# Patient Record
Sex: Male | Born: 1965 | Race: Black or African American | Hispanic: No | Marital: Married | Smoking: Current every day smoker
Health system: Southern US, Community
[De-identification: ages and names within clinical notes are randomized; demographics above are authoritative.]

## PROBLEM LIST (undated history)

## (undated) DIAGNOSIS — I10 Essential (primary) hypertension: Secondary | ICD-10-CM

## (undated) HISTORY — PX: MANDIBLE FRACTURE SURGERY: SHX706

---

## 1998-12-04 ENCOUNTER — Emergency Department (HOSPITAL_COMMUNITY): Admission: EM | Admit: 1998-12-04 | Discharge: 1998-12-04 | Payer: Self-pay | Admitting: Emergency Medicine

## 2000-03-04 ENCOUNTER — Emergency Department (HOSPITAL_COMMUNITY): Admission: EM | Admit: 2000-03-04 | Discharge: 2000-03-04 | Payer: Self-pay | Admitting: Emergency Medicine

## 2001-04-03 ENCOUNTER — Emergency Department (HOSPITAL_COMMUNITY): Admission: EM | Admit: 2001-04-03 | Discharge: 2001-04-03 | Payer: Self-pay | Admitting: Emergency Medicine

## 2001-04-03 ENCOUNTER — Encounter: Payer: Self-pay | Admitting: Emergency Medicine

## 2002-12-30 ENCOUNTER — Encounter: Payer: Self-pay | Admitting: Emergency Medicine

## 2002-12-30 ENCOUNTER — Emergency Department (HOSPITAL_COMMUNITY): Admission: AC | Admit: 2002-12-30 | Discharge: 2002-12-30 | Payer: Self-pay

## 2003-01-07 ENCOUNTER — Emergency Department (HOSPITAL_COMMUNITY): Admission: EM | Admit: 2003-01-07 | Discharge: 2003-01-07 | Payer: Self-pay | Admitting: Emergency Medicine

## 2006-06-24 ENCOUNTER — Emergency Department (HOSPITAL_COMMUNITY): Admission: EM | Admit: 2006-06-24 | Discharge: 2006-06-25 | Payer: Self-pay | Admitting: Emergency Medicine

## 2008-12-20 ENCOUNTER — Emergency Department (HOSPITAL_COMMUNITY): Admission: EM | Admit: 2008-12-20 | Discharge: 2008-12-20 | Payer: Self-pay | Admitting: Emergency Medicine

## 2008-12-21 ENCOUNTER — Emergency Department (HOSPITAL_COMMUNITY): Admission: EM | Admit: 2008-12-21 | Discharge: 2008-12-21 | Payer: Self-pay | Admitting: Emergency Medicine

## 2009-05-29 ENCOUNTER — Emergency Department (HOSPITAL_COMMUNITY): Admission: EM | Admit: 2009-05-29 | Discharge: 2009-05-29 | Payer: Self-pay | Admitting: Emergency Medicine

## 2010-09-19 LAB — DIFFERENTIAL
Basophils Absolute: 0 10*3/uL (ref 0.0–0.1)
Lymphs Abs: 2.1 10*3/uL (ref 0.7–4.0)
Monocytes Absolute: 0.7 10*3/uL (ref 0.1–1.0)

## 2010-09-19 LAB — CBC
HCT: 49.5 % (ref 39.0–52.0)
Hemoglobin: 16.7 g/dL (ref 13.0–17.0)
MCHC: 33.7 g/dL (ref 30.0–36.0)
MCV: 94.3 fL (ref 78.0–100.0)
Platelets: 179 10*3/uL (ref 150–400)
RBC: 5.25 MIL/uL (ref 4.22–5.81)
RDW: 12.9 % (ref 11.5–15.5)
WBC: 17.3 10*3/uL — ABNORMAL HIGH (ref 4.0–10.5)

## 2010-09-19 LAB — COMPREHENSIVE METABOLIC PANEL
ALT: 28 U/L (ref 0–53)
AST: 40 U/L — ABNORMAL HIGH (ref 0–37)
Alkaline Phosphatase: 84 U/L (ref 39–117)
CO2: 21 mEq/L (ref 19–32)
Chloride: 97 mEq/L (ref 96–112)
GFR calc Af Amer: >60 mL/min (ref >60)
GFR calc non Af Amer: >60 mL/min (ref >60)
Sodium: 136 mEq/L (ref 135–145)
Total Bilirubin: 1.2 mg/dL (ref 0.3–1.2)

## 2010-09-19 LAB — POCT I-STAT, CHEM 8
BUN: 5 mg/dL — ABNORMAL LOW (ref 6–23)
Calcium, Ion: 1.08 mmol/L — ABNORMAL LOW (ref 1.12–1.32)
Chloride: 99 mEq/L (ref 96–112)
Creatinine, Ser: 1 mg/dL (ref 0.4–1.5)
Glucose, Bld: 99 mg/dL (ref 70–99)
HCT: 54 % — ABNORMAL HIGH (ref 39.0–52.0)
Hemoglobin: 18.4 g/dL — ABNORMAL HIGH (ref 13.0–17.0)
Potassium: 4.7 mEq/L (ref 3.5–5.1)
Sodium: 134 mEq/L — ABNORMAL LOW (ref 135–145)
TCO2: 24 mmol/L (ref 0–100)

## 2010-09-19 LAB — ETHANOL: Alcohol, Ethyl (B): <5 mg/dL (ref 0–10)

## 2010-09-19 LAB — URINALYSIS, ROUTINE W REFLEX MICROSCOPIC
Ketones, ur: >80 mg/dL — AB
Leukocytes, UA: NEGATIVE
Nitrite: NEGATIVE
Specific Gravity, Urine: 1.03 (ref 1.005–1.030)
pH: 6 (ref 5.0–8.0)

## 2010-09-19 LAB — RAPID URINE DRUG SCREEN, HOSP PERFORMED
Cocaine: POSITIVE — AB
Tetrahydrocannabinol: POSITIVE — AB

## 2011-05-15 ENCOUNTER — Encounter: Payer: Self-pay | Admitting: *Deleted

## 2011-05-15 ENCOUNTER — Emergency Department (INDEPENDENT_AMBULATORY_CARE_PROVIDER_SITE_OTHER): Payer: Self-pay

## 2011-05-15 ENCOUNTER — Emergency Department (HOSPITAL_BASED_OUTPATIENT_CLINIC_OR_DEPARTMENT_OTHER)
Admission: EM | Admit: 2011-05-15 | Discharge: 2011-05-15 | Disposition: A | Discharge status: Home / Self Care | Payer: Self-pay | Attending: Emergency Medicine | Admitting: Emergency Medicine

## 2011-05-15 DIAGNOSIS — S46912A Strain of unspecified muscle, fascia and tendon at shoulder and upper arm level, left arm, initial encounter: Secondary | ICD-10-CM

## 2011-05-15 DIAGNOSIS — M25519 Pain in unspecified shoulder: Secondary | ICD-10-CM

## 2011-05-15 DIAGNOSIS — IMO0002 Reserved for concepts with insufficient information to code with codable children: Secondary | ICD-10-CM | POA: Insufficient documentation

## 2011-05-15 DIAGNOSIS — Y9368 Activity, volleyball (beach) (court): Secondary | ICD-10-CM | POA: Insufficient documentation

## 2011-05-15 DIAGNOSIS — W19XXXA Unspecified fall, initial encounter: Secondary | ICD-10-CM

## 2011-05-15 HISTORY — DX: Essential (primary) hypertension: I10

## 2011-05-15 MED ORDER — HYDROCODONE-ACETAMINOPHEN 5-500 MG PO TABS: 1.0000 | ORAL_TABLET | Freq: Four times a day (QID) | ORAL | Status: AC | PRN

## 2011-05-15 MED ORDER — OXYCODONE-ACETAMINOPHEN 5-325 MG PO TABS
2.0000 | ORAL_TABLET | Freq: Once | ORAL | Status: AC
  Administered 2011-05-15: 2 via ORAL
  Filled 2011-05-15: qty 2

## 2011-05-15 MED ORDER — NAPROXEN 500 MG PO TABS: 500.0000 mg | ORAL_TABLET | Freq: Two times a day (BID) | ORAL | Status: DC

## 2011-05-15 NOTE — ED Notes (Signed)
Patient states that he was playing volleyball yesterday and fell, hurts to move shoulder or lift L arm, took ibuprofen this morning no relief

## 2011-05-15 NOTE — ED Provider Notes (Signed)
History     CSN: [id number] Arrival date & time: 05/15/2011  8:01 AM   First MD Initiated Contact with Patient 05/15/11 [phone]      Chief Complaint  Patient presents with  . Shoulder Pain    (Consider location/radiation/quality/duration/timing/severity/associated sxs/prior treatment) HPI Comments: Patient states he was playing volleyball last evening and when attempting to dive to hit the ball landed on his left shoulder. States that he's had pain since. Pain is relatively diffuse and present the deltoid and trapezius regions. He also has some tenderness in the subscapularis region. Range of motion is limited secondary to pain. He has no weakness or paresthesias. Has tried NSAIDs without relief.  No prior injury to this shoulder  Patient is a 45 y.o. male presenting with shoulder pain. The history is provided by the patient. No language interpreter was used.  Shoulder Pain This is a new problem. The current episode started yesterday. The problem occurs constantly. The problem has been gradually worsening. Pertinent negatives include no chest pain, no abdominal pain, no headaches and no shortness of breath. The symptoms are aggravated by bending. The symptoms are relieved by nothing. Treatments tried: nsiads. The treatment provided no relief.    Past Medical History  Diagnosis Date  . Hypertension     History reviewed. No pertinent past surgical history.  No family history on file.  History  Substance Use Topics  . Smoking status: Current Everyday Smoker  . Smokeless tobacco: Not on file  . Alcohol Use: No      Review of Systems  Constitutional: Negative for fever, activity change, appetite change and fatigue.  HENT: Negative for congestion, sore throat, rhinorrhea, neck pain and neck stiffness.   Respiratory: Negative for cough and shortness of breath.   Cardiovascular: Negative for chest pain and palpitations.  Gastrointestinal: Negative for nausea, vomiting, abdominal  pain, diarrhea and constipation.  Genitourinary: Negative for dysuria, urgency, frequency and flank pain.  Musculoskeletal: Positive for arthralgias. Negative for myalgias, back pain and joint swelling.  Neurological: Negative for dizziness, weakness, light-headedness, numbness and headaches.  All other systems reviewed and are negative.    Allergies  Review of patient's allergies indicates no known allergies.  Home Medications   Current Outpatient Rx  Name Route Sig Dispense Refill  . HYDROCODONE-ACETAMINOPHEN 5-500 MG PO TABS Oral Take 1-2 tablets by mouth every 6 (six) hours as needed for pain. 15 tablet 0  . NAPROXEN 500 MG PO TABS Oral Take 1 tablet (500 mg total) by mouth 2 (two) times daily. 30 tablet 0    BP 150/102  Pulse 78  Temp 98.1 F (36.7 C)  Resp 19  SpO2 100%  Physical Exam  Nursing note and vitals reviewed. Constitutional: He is oriented to person, place, and time. He appears well-developed and well-nourished. No distress.  HENT:  Head: Normocephalic and atraumatic.  Mouth/Throat: Oropharynx is clear and moist.  Eyes: Conjunctivae and EOM are normal. Pupils are equal, round, and reactive to light.  Neck: Normal range of motion. Neck supple.  Cardiovascular: Normal rate, regular rhythm, normal heart sounds and intact distal pulses.  Exam reveals no gallop and no friction rub.   No murmur heard. Pulmonary/Chest: Effort normal and breath sounds normal. No respiratory distress.  Abdominal: Soft. Bowel sounds are normal. There is no tenderness.  Musculoskeletal: He exhibits tenderness.       Left shoulder: He exhibits decreased range of motion, tenderness and pain. He exhibits no swelling, no effusion, no deformity and normal pulse.  Neurological: He is alert and oriented to person, place, and time.       Motor and sensation intact in the axillary nerve distribution on the left. Motor and sensation intact in all nerve distributions distally in the left  Skin:  Skin is warm and dry. No rash noted.    ED Course  Procedures (including critical care time)  Labs Reviewed - No data to display Dg Shoulder Left  05/15/2011  *RADIOLOGY REPORT*  Clinical Data: Left shoulder pain, fall  LEFT SHOULDER - 2+ VIEW  Comparison: None.  Findings: Normal alignment.  No fracture, subluxation or dislocation.  AC joint aligned.  No degenerative change  IMPRESSION: No acute finding  Original Report Authenticated By: Judie Petit. Ruel Favors, M.D.     1. Left shoulder strain       MDM  Imaging negative. Injury consistent with a shoulder strain. He be placed in a sling. I encouraged him to perform full range of motion of the shoulder several times daily to prevent frozen shoulder. He'll be prescribed anti-inflammatory as well as a pain medication. He is provided sports medicine followup in one week. I also instructed him to apply ice several times daily for the first 2 days and heat thereafter. Provided clear signs and symptoms for which to return to the emergency department.        Dayton Bailiff, MD 05/15/11 [phone]

## 2011-11-13 ENCOUNTER — Emergency Department (HOSPITAL_COMMUNITY): Payer: Self-pay

## 2011-11-13 ENCOUNTER — Emergency Department (HOSPITAL_COMMUNITY)
Admission: EM | Admit: 2011-11-13 | Discharge: 2011-11-14 | Disposition: A | Discharge status: Home / Self Care | Payer: Self-pay | Attending: Emergency Medicine | Admitting: Emergency Medicine

## 2011-11-13 ENCOUNTER — Encounter (HOSPITAL_COMMUNITY): Payer: Self-pay | Admitting: *Deleted

## 2011-11-13 DIAGNOSIS — S62339A Displaced fracture of neck of unspecified metacarpal bone, initial encounter for closed fracture: Secondary | ICD-10-CM

## 2011-11-13 DIAGNOSIS — W2209XA Striking against other stationary object, initial encounter: Secondary | ICD-10-CM | POA: Insufficient documentation

## 2011-11-13 DIAGNOSIS — S62309A Unspecified fracture of unspecified metacarpal bone, initial encounter for closed fracture: Secondary | ICD-10-CM | POA: Insufficient documentation

## 2011-11-13 DIAGNOSIS — R6884 Jaw pain: Secondary | ICD-10-CM | POA: Insufficient documentation

## 2011-11-13 MED ORDER — HYDROCODONE-ACETAMINOPHEN 5-325 MG PO TABS
1.0000 | ORAL_TABLET | Freq: Once | ORAL | Status: AC
  Administered 2011-11-13: 1 via ORAL
  Filled 2011-11-13: qty 1

## 2011-11-13 MED ORDER — HYDROCODONE-ACETAMINOPHEN 5-325 MG PO TABS: 1.0000 | ORAL_TABLET | ORAL | Status: AC | PRN
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
## 2011-11-13 NOTE — Discharge Instructions (Signed)
Boxer's Fracture  You have a break (fracture) of the fifth metacarpal bone. This is commonly called a boxer's fracture. This is the bone in the hand where the little finger attaches. The fracture is in the end of that bone, closest to the little finger. It is usually caused when you hit an object with a clenched fist. Often, the knuckle is pushed down by the impact. Sometimes, the fracture rotates out of position. A boxer's fracture will usually heal within 6 weeks, if it is treated properly and protected from re-injury. Surgery is sometimes needed.  A cast, splint, or bulky hand dressing may be used to protect and immobilize a boxer's fracture. Do not remove this device or dressing until your caregiver approves. Keep your hand elevated, and apply ice packs for 15 to 20 minutes every 2 hours, for the first 2 days. Elevation and ice help reduce swelling and relieve pain. See your caregiver, or an orthopedic specialist, for follow-up care within the next 10 days. This is to make sure your fracture is healing properly.  Document Released: 05/30/2005 Document Revised: 05/19/2011 Document Reviewed: 11/17/2006  [employer] Patient Information 2012 [employer]

## 2011-11-13 NOTE — ED Notes (Signed)
He has fallen x2 in the past weeks and he has pain in his jaw and he hurt his lt hand tonight

## 2011-11-13 NOTE — ED Notes (Signed)
Pt states understanding of discharge instructions 
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
## 2011-11-16 ENCOUNTER — Other Ambulatory Visit (HOSPITAL_COMMUNITY): Payer: Self-pay | Admitting: Oral and Maxillofacial Surgery

## 2011-11-16 DIAGNOSIS — S02609A Fracture of mandible, unspecified, initial encounter for closed fracture: Secondary | ICD-10-CM

## 2011-11-17 ENCOUNTER — Encounter (HOSPITAL_COMMUNITY): Payer: Self-pay

## 2011-11-17 ENCOUNTER — Ambulatory Visit (HOSPITAL_COMMUNITY)
Admission: RE | Admit: 2011-11-17 | Discharge: 2011-11-17 | Disposition: A | Discharge status: Home / Self Care | Payer: Self-pay | Source: Ambulatory Visit | Attending: Oral and Maxillofacial Surgery | Admitting: Oral and Maxillofacial Surgery

## 2011-11-17 DIAGNOSIS — IMO0002 Reserved for concepts with insufficient information to code with codable children: Secondary | ICD-10-CM | POA: Insufficient documentation

## 2011-11-17 DIAGNOSIS — S022XXA Fracture of nasal bones, initial encounter for closed fracture: Secondary | ICD-10-CM | POA: Insufficient documentation

## 2011-11-17 DIAGNOSIS — S02609A Fracture of mandible, unspecified, initial encounter for closed fracture: Secondary | ICD-10-CM

## 2011-12-26 ENCOUNTER — Emergency Department (HOSPITAL_COMMUNITY): Payer: Self-pay

## 2011-12-26 ENCOUNTER — Emergency Department (HOSPITAL_COMMUNITY)
Admission: EM | Admit: 2011-12-26 | Discharge: 2011-12-26 | Disposition: A | Discharge status: Home / Self Care | Payer: Self-pay | Attending: Emergency Medicine | Admitting: Emergency Medicine

## 2011-12-26 ENCOUNTER — Encounter (HOSPITAL_COMMUNITY): Payer: Self-pay | Admitting: *Deleted

## 2011-12-26 DIAGNOSIS — S62339A Displaced fracture of neck of unspecified metacarpal bone, initial encounter for closed fracture: Secondary | ICD-10-CM

## 2011-12-26 DIAGNOSIS — W108XXA Fall (on) (from) other stairs and steps, initial encounter: Secondary | ICD-10-CM | POA: Insufficient documentation

## 2011-12-26 DIAGNOSIS — Z4689 Encounter for fitting and adjustment of other specified devices: Secondary | ICD-10-CM | POA: Insufficient documentation

## 2011-12-26 DIAGNOSIS — S62309A Unspecified fracture of unspecified metacarpal bone, initial encounter for closed fracture: Secondary | ICD-10-CM | POA: Insufficient documentation

## 2011-12-26 MED ORDER — IBUPROFEN 400 MG PO TABS
800.0000 mg | ORAL_TABLET | Freq: Once | ORAL | Status: AC
  Administered 2011-12-26: 800 mg via ORAL
  Filled 2011-12-26: qty 2

## 2011-12-26 MED ORDER — IBUPROFEN 100 MG/5ML PO SUSP
ORAL | Status: AC
  Filled 2011-12-26: qty 40

## 2011-12-26 NOTE — Progress Notes (Signed)
Orthopedic Tech Progress Note Patient Details:  Kyle Cantu 1966/06/01 [id number]  Ortho Devices Type of Ortho Device: Ulna gutter splint Ortho Device/Splint Location: left UE   Devony Mcgrady T 12/26/2011, 1:21 PM

## 2011-12-26 NOTE — ED Notes (Signed)
Pt reports he needs his cast replaced to left hand. States has cast for boxers fracture and fell down the stairs and cast came off.

## 2011-12-26 NOTE — ED Notes (Signed)
Pt given cup of coffee.
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
## 2011-12-26 NOTE — ED Notes (Signed)
Pt reports falling down the stairs last night, breaking his cast off his (L) hand.  (L) hand is noted to be swollen, appears to have a boxers break.  Denies further injury.  pts mouth wired shut-pt being treated for broken jaw.

## 2011-12-27 NOTE — ED Provider Notes (Signed)
Medical screening examination/treatment/procedure(s) were conducted as a shared visit with non-physician practitioner(s) and myself.  I personally evaluated the patient during the encounter  Parish Dubose, MD 12/27/11 1547 

## 2012-01-24 ENCOUNTER — Other Ambulatory Visit (HOSPITAL_COMMUNITY): Payer: Self-pay | Admitting: Oral and Maxillofacial Surgery

## 2012-01-24 DIAGNOSIS — S02609A Fracture of mandible, unspecified, initial encounter for closed fracture: Secondary | ICD-10-CM

## 2012-01-25 ENCOUNTER — Other Ambulatory Visit (HOSPITAL_COMMUNITY): Payer: Self-pay

## 2012-01-30 ENCOUNTER — Encounter (HOSPITAL_COMMUNITY): Payer: Self-pay

## 2012-01-30 ENCOUNTER — Emergency Department (INDEPENDENT_AMBULATORY_CARE_PROVIDER_SITE_OTHER)
Admission: EM | Admit: 2012-01-30 | Discharge: 2012-01-30 | Disposition: A | Discharge status: Home / Self Care | Payer: Self-pay | Source: Home / Self Care | Attending: Emergency Medicine | Admitting: Emergency Medicine

## 2012-01-30 DIAGNOSIS — L03019 Cellulitis of unspecified finger: Secondary | ICD-10-CM

## 2012-01-30 MED ORDER — DOXYCYCLINE HYCLATE 100 MG PO CAPS: 100.0000 mg | ORAL_CAPSULE | Freq: Two times a day (BID) | ORAL | Status: AC

## 2012-01-30 MED ORDER — IBUPROFEN 800 MG PO TABS: 800.0000 mg | ORAL_TABLET | Freq: Three times a day (TID) | ORAL | Status: DC

## 2012-01-30 NOTE — ED Notes (Signed)
C/o pain and swelling to rt index finger for 4 days. Denies injury.

## 2012-01-30 NOTE — ED Provider Notes (Signed)
History     CSN: [id number]  Arrival date & time 01/30/12  1207   First MD Initiated Contact with Patient 01/30/12 1217      Chief Complaint  Patient presents with  . Hand Pain    (Consider location/radiation/quality/duration/timing/severity/associated sxs/prior treatment) Patient is a 46 y.o. male presenting with hand pain. The history is provided by the patient.  Hand Pain This is a new problem. The current episode started more than 2 days ago. The problem occurs constantly. The problem has not changed since onset.Pertinent negatives include no abdominal pain. Nothing relieves the symptoms. He has tried nothing for the symptoms. The treatment provided no relief.    Past Medical History  Diagnosis Date  . Hypertension     Past Surgical History  Procedure Date  . Mandible fracture surgery     No family history on file.  History  Substance Use Topics  . Smoking status: Current Everyday Smoker  . Smokeless tobacco: Not on file  . Alcohol Use: No      Review of Systems  Constitutional: Negative for fever, chills, diaphoresis, activity change, appetite change and fatigue.  Gastrointestinal: Negative for abdominal pain.  Musculoskeletal: Negative for joint swelling.  Skin: Negative for pallor, rash and wound.    Allergies  Review of patient's allergies indicates no known allergies.  Home Medications   Current Outpatient Rx  Name Route Sig Dispense Refill  . DOXYCYCLINE HYCLATE 100 MG PO CAPS Oral Take 1 capsule (100 mg total) by mouth 2 (two) times daily. 20 capsule 0  . IBUPROFEN 800 MG PO TABS Oral Take 1 tablet (800 mg total) by mouth 3 (three) times daily. 21 tablet 0    BP 144/99  Pulse 66  Temp 97.9 F (36.6 C) (Oral)  Resp 18  SpO2 100%  Physical Exam  Nursing note and vitals reviewed. Constitutional: He appears well-developed and well-nourished.  Musculoskeletal: He exhibits tenderness.  Neurological: He is alert.  Skin: No erythema.    ED  Course  INCISION AND DRAINAGE Performed by: Marizol Borror Authorized by: Jimmie Molly Consent: Verbal consent obtained. Risks and benefits: risks, benefits and alternatives were discussed Consent given by: patient Patient understanding: patient states understanding of the procedure being performed Patient identity confirmed: verbally with patient Type: abscess Body area: upper extremity Location details: right index finger Anesthesia: local infiltration Local anesthetic: lidocaine 1% without epinephrine Anesthetic total: 2 ml Patient sedated: no Scalpel size: 11 Incision type: single straight Complexity: simple Drainage: bloody Patient tolerance: Patient tolerated the procedure well with no immediate complications. Comments: After exploratory posterior wound minimal discrete sanguinolent or bloody discharge noted. No purulent material observed.   (including critical care time)  Labs Reviewed - No data to display No results found.   1. Paronychia of finger      Patient exhibited tenderness on the ulnar side of peri-ungual space, in comparison with the opposite side is a not observe a significant difference. No erythema, uncertain if there was any fluctuance post exploratory puncture wound revealed no purulent material. Patient had no signs of a felon, or tenosynovitis of the finger. Of note his exam was somewhat disproportionate with his clinical findings as patient complaining of severe pain with discrete mild palpation.  MDM          Jimmie Molly, MD 01/30/12 1438

## 2012-01-31 ENCOUNTER — Emergency Department (HOSPITAL_COMMUNITY)
Admission: EM | Admit: 2012-01-31 | Discharge: 2012-01-31 | Disposition: A | Discharge status: Home / Self Care | Payer: Self-pay | Source: Home / Self Care | Attending: Family Medicine | Admitting: Family Medicine

## 2012-01-31 ENCOUNTER — Emergency Department (HOSPITAL_COMMUNITY)
Admission: EM | Admit: 2012-01-31 | Discharge: 2012-01-31 | Disposition: A | Discharge status: Home / Self Care | Payer: Self-pay | Attending: Emergency Medicine | Admitting: Emergency Medicine

## 2012-01-31 ENCOUNTER — Encounter (HOSPITAL_COMMUNITY): Payer: Self-pay | Admitting: *Deleted

## 2012-01-31 ENCOUNTER — Ambulatory Visit (HOSPITAL_COMMUNITY)
Admission: RE | Admit: 2012-01-31 | Discharge: 2012-01-31 | Disposition: A | Discharge status: Home / Self Care | Payer: Self-pay | Source: Ambulatory Visit | Attending: Oral and Maxillofacial Surgery | Admitting: Oral and Maxillofacial Surgery

## 2012-01-31 DIAGNOSIS — S02620A Fracture of subcondylar process of mandible, unspecified side, initial encounter for closed fracture: Secondary | ICD-10-CM | POA: Insufficient documentation

## 2012-01-31 DIAGNOSIS — S02609A Fracture of mandible, unspecified, initial encounter for closed fracture: Secondary | ICD-10-CM

## 2012-01-31 DIAGNOSIS — F17200 Nicotine dependence, unspecified, uncomplicated: Secondary | ICD-10-CM | POA: Insufficient documentation

## 2012-01-31 DIAGNOSIS — I10 Essential (primary) hypertension: Secondary | ICD-10-CM | POA: Insufficient documentation

## 2012-01-31 DIAGNOSIS — X58XXXA Exposure to other specified factors, initial encounter: Secondary | ICD-10-CM | POA: Insufficient documentation

## 2012-01-31 DIAGNOSIS — M79646 Pain in unspecified finger(s): Secondary | ICD-10-CM

## 2012-01-31 DIAGNOSIS — M79609 Pain in unspecified limb: Secondary | ICD-10-CM | POA: Insufficient documentation

## 2012-01-31 MED ORDER — IBUPROFEN 400 MG PO TABS
600.0000 mg | ORAL_TABLET | Freq: Once | ORAL | Status: AC
  Administered 2012-01-31: 600 mg via ORAL
  Filled 2012-01-31: qty 1

## 2012-01-31 MED ORDER — HYDROCODONE-ACETAMINOPHEN 5-325 MG PO TABS
1.0000 | ORAL_TABLET | Freq: Once | ORAL | Status: AC
  Administered 2012-01-31: 1 via ORAL
  Filled 2012-01-31: qty 1

## 2012-01-31 NOTE — ED Provider Notes (Signed)
History  This chart was scribed for Suzi Roots, MD by Ladona Ridgel Day. This patient was seen in room TR04C/TR04C and the patient's care was started at 1504.   CSN: [id number]  Arrival date & time 01/31/12  1504   None     Chief Complaint  Patient presents with  . Hand Pain    right index finger   The history is provided by the patient. No language interpreter was used.   Kyle Cantu is a 46 y.o. male who presents to the Emergency Department complaining of constant right index finger pain. He states he recently [facility] and the pain has not subsided. He states any pressure or movement to his finger makes it worse. He denies any other injuries/illnesses at this time. No fever or chills. No spreading redness, increased swelling. No hx diabetes.   Past Medical History  Diagnosis Date  . Hypertension     Past Surgical History  Procedure Date  . Mandible fracture surgery     No family history on file.  History  Substance Use Topics  . Smoking status: Current Everyday Smoker  . Smokeless tobacco: Not on file  . Alcohol Use: No      Review of Systems  Constitutional: Negative for fever and chills.  Respiratory: Negative for shortness of breath.   Gastrointestinal: Negative for nausea and vomiting.  Musculoskeletal:       Pain of his right index finger. States recent [facility] at [facility] last [facility].  Neurological: Negative for weakness and numbness.  All other systems reviewed and are negative.    Allergies  Review of patient's allergies indicates no known allergies.  Home Medications   Current Outpatient Rx  Name Route Sig Dispense Refill  . DOXYCYCLINE HYCLATE 100 MG PO CAPS Oral Take 1 capsule (100 mg total) by mouth 2 (two) times daily. 20 capsule 0  . IBUPROFEN 800 MG PO TABS Oral Take 1 tablet (800 mg total) by mouth 3 (three) times daily. 21 tablet 0    Triage Vitals: BP 145/110  Pulse 79  Temp 98.1 F (36.7 C) (Oral)  Resp 18   SpO2 99%  Physical Exam  Nursing note and vitals reviewed. Constitutional: He is oriented to person, place, and time. He appears well-developed and well-nourished. No distress.  HENT:  Head: Normocephalic and atraumatic.  Eyes: Conjunctivae are normal.  Neck: Neck supple. No tracheal deviation present.  Cardiovascular: Normal rate.   Pulmonary/Chest: Effort normal. No respiratory distress.  Musculoskeletal: Normal range of motion.       Right index finger distal phalanx tenderness. No erythema or increased warmth. Normal cap refill distally. Tendon fxn intact, good rom digit without pain. No felon/paronychia noted.   Neurological: He is alert and oriented to person, place, and time.  Skin: Skin is warm and dry.  Psychiatric: He has a normal mood and affect.    ED Course  Procedures (including critical care time) DIAGNOSTIC STUDIES: Oxygen Saturation is 99% on room air, normal by my interpretation.         MDM  I personally performed the services described in this documentation, which was scribed in my presence. The recorded information has been reviewed and considered. Suzi Roots, MD   Pt s/p I and ?paronychia yesterday. States today finger still sore. No increased swelling. No redness. Clinically, no evidence recurrent paronychia/felon on todays visit.  Discussed w pt, will splint, given pain med in ed, and discussed hand  f/u in next couple days if symptoms fail to improve/resolve.         Suzi Roots, MD 01/31/12 [phone]

## 2012-01-31 NOTE — ED Notes (Signed)
Pt is here with right index finger infection that [facility] last nite and now worse per patient

## 2012-01-31 NOTE — Progress Notes (Signed)
Orthopedic Tech Progress Note Patient Details:  Kyle Cantu 30-Jan-1966 [id number]  Ortho Devices Type of Ortho Device: Finger splint Ortho Device/Splint Location: (R) UE Ortho Device/Splint Interventions: Application   Jennye Moccasin 01/31/2012, 4:00 PM

## 2013-08-18 ENCOUNTER — Encounter (HOSPITAL_COMMUNITY): Payer: Self-pay | Admitting: Emergency Medicine

## 2013-08-18 ENCOUNTER — Emergency Department (HOSPITAL_COMMUNITY)
Admission: EM | Admit: 2013-08-18 | Discharge: 2013-08-18 | Disposition: A | Discharge status: Home / Self Care | Payer: Self-pay | Attending: Emergency Medicine | Admitting: Emergency Medicine

## 2013-08-18 DIAGNOSIS — Z79899 Other long term (current) drug therapy: Secondary | ICD-10-CM | POA: Insufficient documentation

## 2013-08-18 DIAGNOSIS — I10 Essential (primary) hypertension: Secondary | ICD-10-CM | POA: Insufficient documentation

## 2013-08-18 DIAGNOSIS — M545 Low back pain, unspecified: Secondary | ICD-10-CM | POA: Insufficient documentation

## 2013-08-18 DIAGNOSIS — M549 Dorsalgia, unspecified: Secondary | ICD-10-CM

## 2013-08-18 DIAGNOSIS — F17200 Nicotine dependence, unspecified, uncomplicated: Secondary | ICD-10-CM | POA: Insufficient documentation

## 2013-08-18 MED ORDER — DIAZEPAM 5 MG PO TABS
5.0000 mg | ORAL_TABLET | Freq: Once | ORAL | Status: AC
  Administered 2013-08-18: 5 mg via ORAL
  Filled 2013-08-18: qty 1

## 2013-08-18 MED ORDER — CYCLOBENZAPRINE HCL 10 MG PO TABS: 10.0000 mg | ORAL_TABLET | Freq: Two times a day (BID) | ORAL | Status: AC | PRN

## 2013-08-18 MED ORDER — KETOROLAC TROMETHAMINE 60 MG/2ML IM SOLN
60.0000 mg | Freq: Once | INTRAMUSCULAR | Status: AC
  Administered 2013-08-18: 60 mg via INTRAMUSCULAR
  Filled 2013-08-18: qty 2

## 2013-08-18 MED ORDER — IBUPROFEN 800 MG PO TABS: 800.0000 mg | ORAL_TABLET | Freq: Three times a day (TID) | ORAL | Status: AC

## 2013-08-18 NOTE — ED Provider Notes (Signed)
Medical screening examination/treatment/procedure(s) were performed by non-physician practitioner and as supervising physician I was immediately available for consultation/collaboration.   EKG Interpretation None        Myah Guynes S Enrrique Mierzwa, MD 08/18/13 2058 

## 2013-08-18 NOTE — ED Provider Notes (Signed)
CSN: [id number]     Arrival date & time 08/18/13  1243 History   First MD Initiated Contact with Patient 08/18/13 1258     Chief Complaint  Patient presents with  . Back Pain     (Consider location/radiation/quality/duration/timing/severity/associated sxs/prior Treatment) HPI Comments: Patient is a 48 year old male who presents with gradual onset of lower back pain that started "a few months ago." Patient reports the pain is the worst in the morning when he first wakes up and improves in about 15 minutes with movement. The pain is "sore" and moderate to severe and does not radiate. The pain is constant. Movement makes the pain worse. 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 Nothing makes the pain better. Patient has tried aspirin for pain. No associated symptoms. No saddles paresthesias or bladder/bowel incontinence.     Patient is a 48 y.o. male presenting with back pain.  Back Pain Associated symptoms: no abdominal pain, no chest pain, no dysuria, no fever and no weakness     Past Medical History  Diagnosis Date  . Hypertension    Past Surgical History  Procedure Laterality Date  . Mandible fracture surgery     History reviewed. No pertinent family history. History  Substance Use Topics  . Smoking status: Current Every Day Smoker  . Smokeless tobacco: Not on file  . Alcohol Use: No    Review of Systems  Constitutional: Negative for fever, chills and fatigue.  HENT: Negative for trouble swallowing.   Eyes: Negative for visual disturbance.  Respiratory: Negative for shortness of breath.   Cardiovascular: Negative for chest pain and palpitations.  Gastrointestinal: Negative for nausea, vomiting, abdominal pain and diarrhea.  Genitourinary: Negative for dysuria and difficulty urinating.  Musculoskeletal: Positive for back pain. Negative for arthralgias and neck pain.  Skin: Negative for color change.   Neurological: Negative for dizziness and weakness.  Psychiatric/Behavioral: Negative for dysphoric mood.      Allergies  Review of patient's allergies indicates no known allergies.  Home Medications   Current Outpatient Rx  Name  Route  Sig  Dispense  Refill  . aspirin 325 MG tablet   Oral   Take 325 mg by mouth every 6 (six) hours as needed. For pain         . lisinopril-hydrochlorothiazide (PRINZIDE,ZESTORETIC) 20-12.5 MG per tablet   Oral   Take 1 tablet by mouth daily.         . cyclobenzaprine (FLEXERIL) 10 MG tablet   Oral   Take 1 tablet (10 mg total) by mouth 2 (two) times daily as needed for muscle spasms.   20 tablet   0   . ibuprofen (ADVIL,MOTRIN) 800 MG tablet   Oral   Take 1 tablet (800 mg total) by mouth 3 (three) times daily.   21 tablet   0    BP 141/86  Pulse 78  Temp(Src) 98 F (36.7 C) (Oral)  Resp 18  SpO2 98% Physical Exam  Nursing note and vitals reviewed. Constitutional: He is oriented to person, place, and time. He appears well-developed and well-nourished. No distress.  HENT:  Head: Normocephalic and atraumatic.  Eyes: Conjunctivae and EOM are normal.  Neck: Normal range of motion.  Cardiovascular: Normal rate and regular rhythm.  Exam reveals no gallop and no friction rub.   No murmur heard. Pulmonary/Chest: Effort normal and breath sounds normal. He has no wheezes. He has no rales.  He exhibits no tenderness.  Abdominal: Soft. He exhibits no distension. There is no tenderness. There is no rebound.  Musculoskeletal: Normal range of motion.  No midline spine tenderness to palpation. Paraspinal lumbar tenderness to palpation.   Neurological: He is alert and oriented to person, place, and time. Coordination normal.  Lower extremity strength and sensation equal and intact bilaterally. Speech is goal-oriented. Moves limbs without ataxia.   Skin: Skin is warm and dry.  Psychiatric: He has a normal mood and affect. His behavior is  normal.    ED Course  Procedures (including critical care time) Labs Review Labs Reviewed - No data to display Imaging Review No results found.   EKG Interpretation None      MDM   Final diagnoses:  Back pain    2:03 PM Patient given toradol and valium for pain. Patient will be discharged with ibuprofen and flexeril. Vitals stable and patient afebrile. No bladder/bowel incontinence or saddle paresthesais.     Emilia Beck, PA-C 08/18/13 1407

## 2013-08-18 NOTE — Discharge Instructions (Signed)
Take Ibuprofen as needed for pain. Take Flexeril as needed for muscle spasm. Refer to attached documents for more information.  °

## 2013-08-18 NOTE — ED Notes (Signed)
Pt reports having lower back pain for extended amount of time, seems to be worse in the morning when he gets up. Denies any injury to back. Ambulatory at triage.

## 2013-08-18 NOTE — ED Notes (Signed)
Onset several months low back pain.  Pt states he thinks at onset he was working on car motor bent over and when stood up felt sharp pain to back and since then when pt sits or wakes from sleep back will be stiff and will taken 10-15 min for pt to get moving.  Once pt is up back is fine.  No numbness/tingling/pain in LE.

## 2013-10-05 IMAGING — CT CT MAXILLOFACIAL W/O CM
3 of 5 series · 16 of 47 positions shown, 19 images · non-contrast
Comparison: 11/13/2011 orthopantogram.

CLINICAL DATA: Trauma striking the jaw.  Right temporal mandibular
joint swelling and pain.

CT MAXILLOFACIAL WITHOUT CONTRAST
TECHNIQUE: Multidetector CT imaging of the maxillofacial
structures was performed. Multiplanar CT image reconstructions were
also generated.

[Series 3: orbit/facial 2.0 h30s · axial · 0.37mm/px · z∈[-166,-22]mm · 11 of 86 slices shown, 14 images]
[im 7/86  brain]
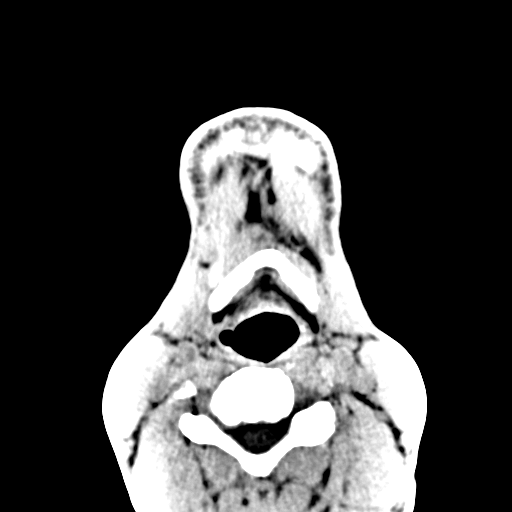
[im 7/86  bone]
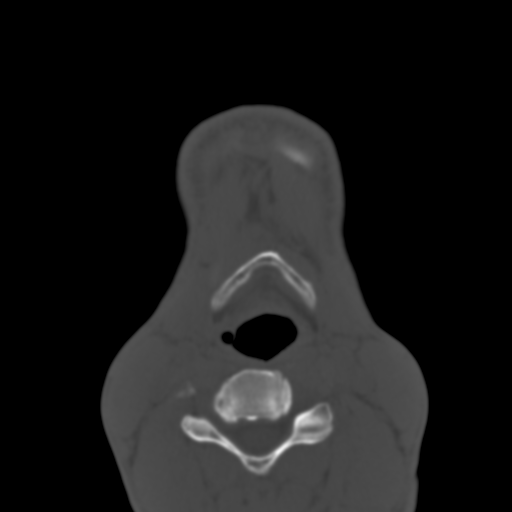
[im 14/86  bone]
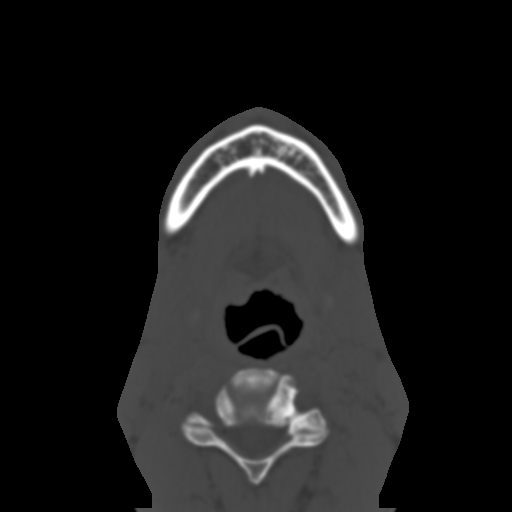
[im 20/86  bone]
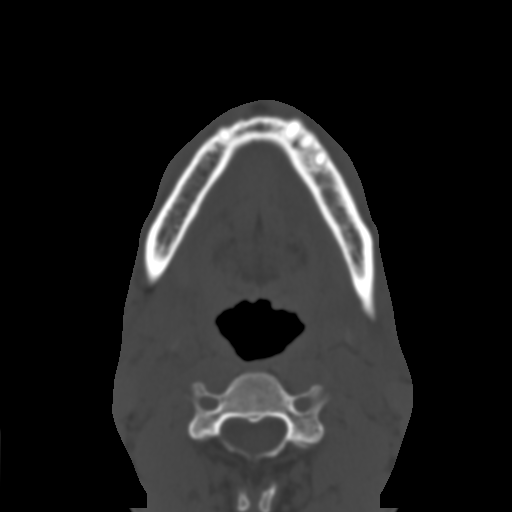
[im 27/86  bone]
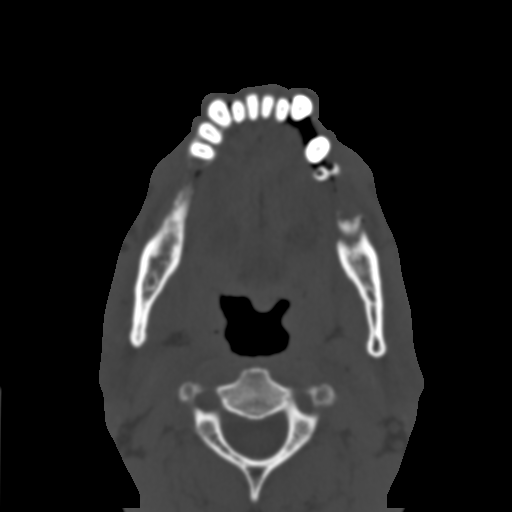
[im 36/86  brain]
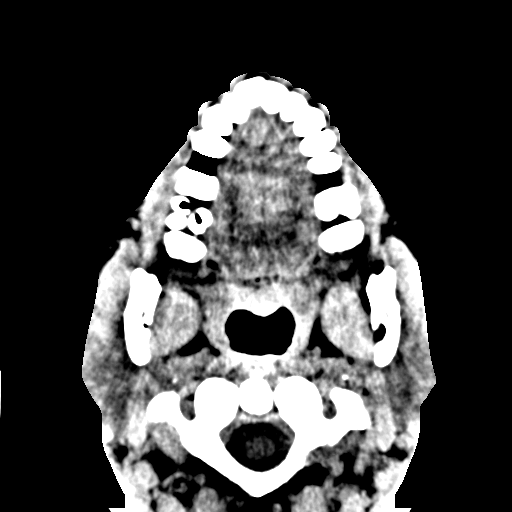
[im 36/86  bone]
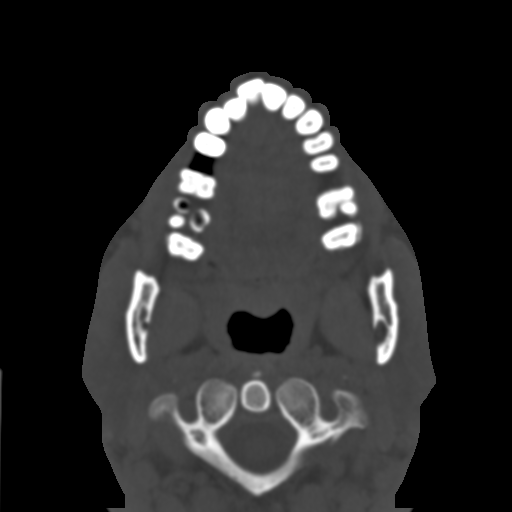
[im 43/86  bone]
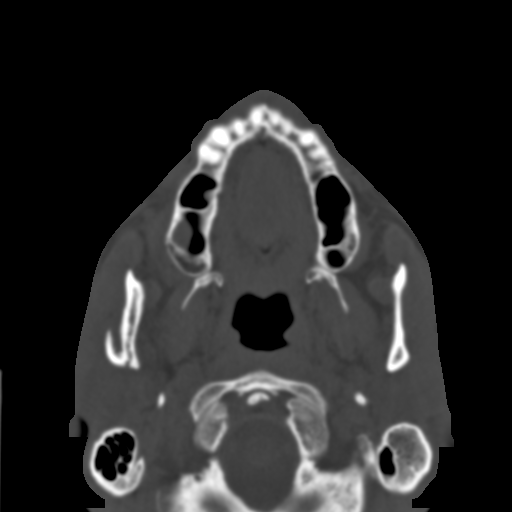
[im 50/86  bone]
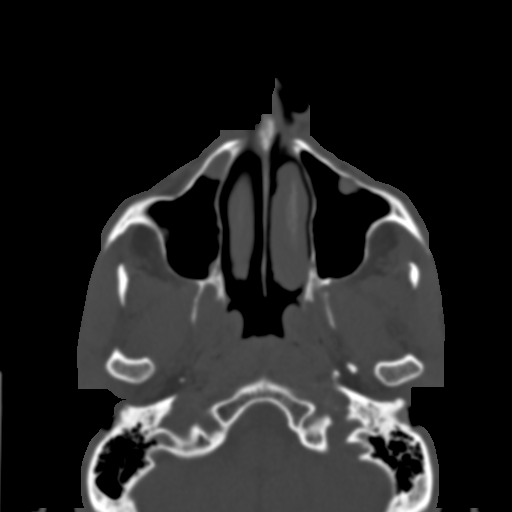
[im 59/86  bone]
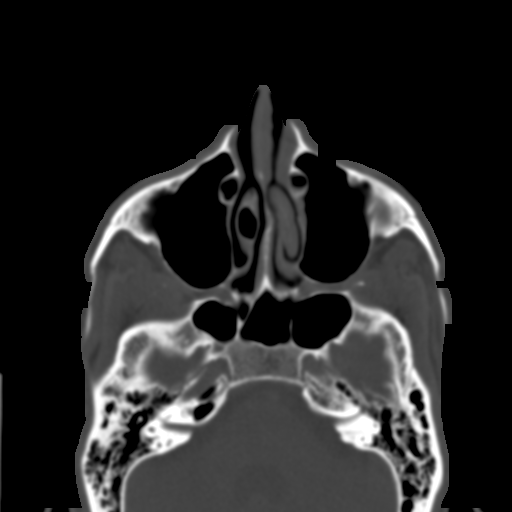
[im 66/86  brain]
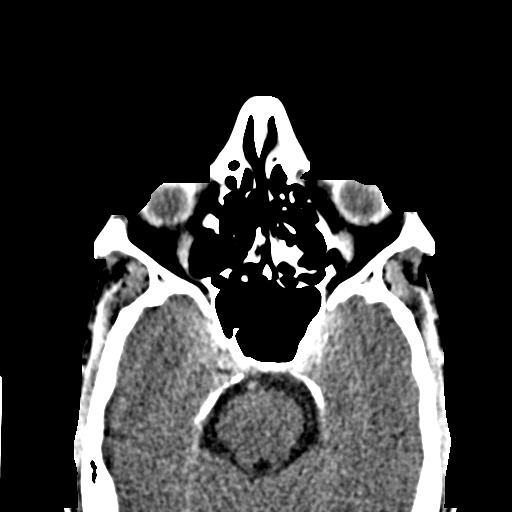
[im 66/86  bone]
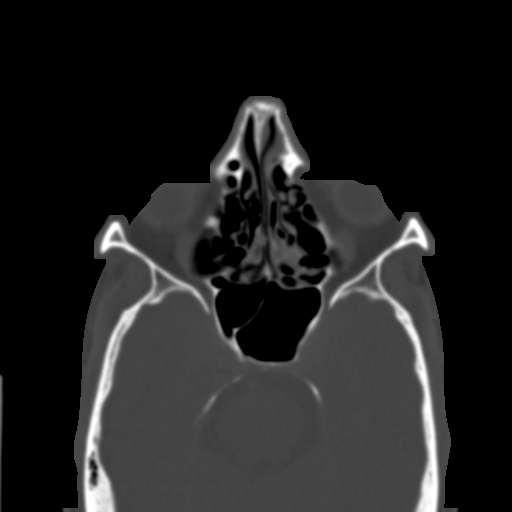
[im 72/86  bone]
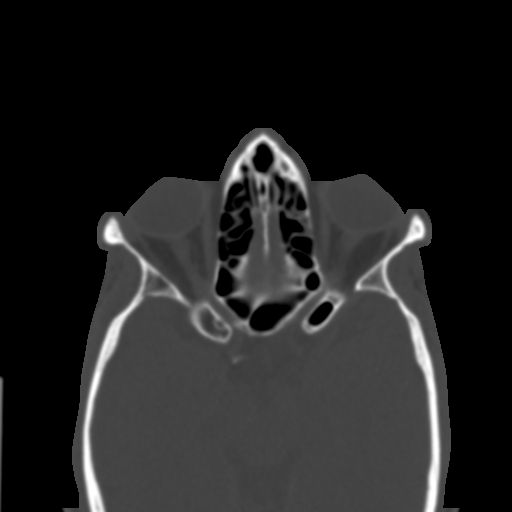
[im 79/86  bone]
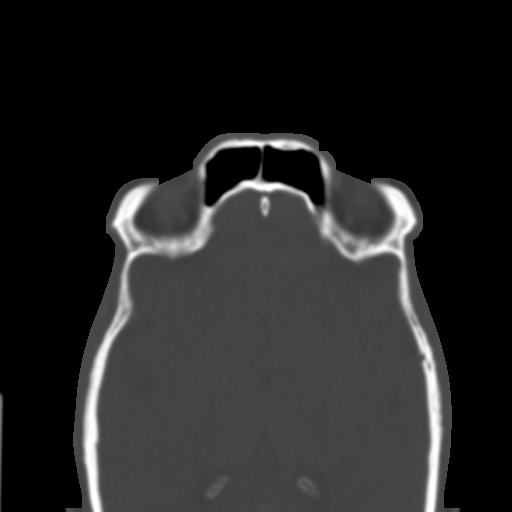

[Series 11: coronals · coronal · 0.33mm/px · 3 of 91 slices shown]
[im 31/91  bone]
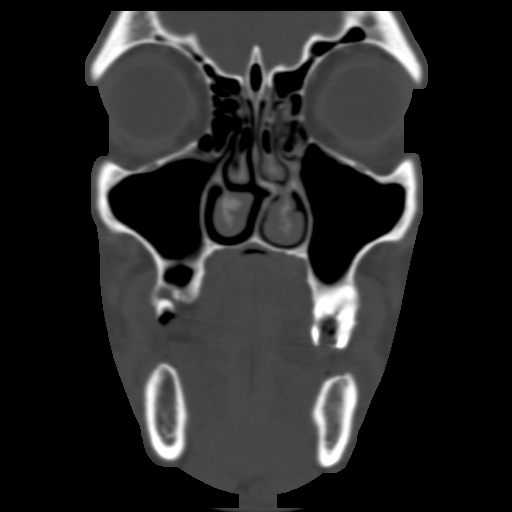
[im 41/91  bone]
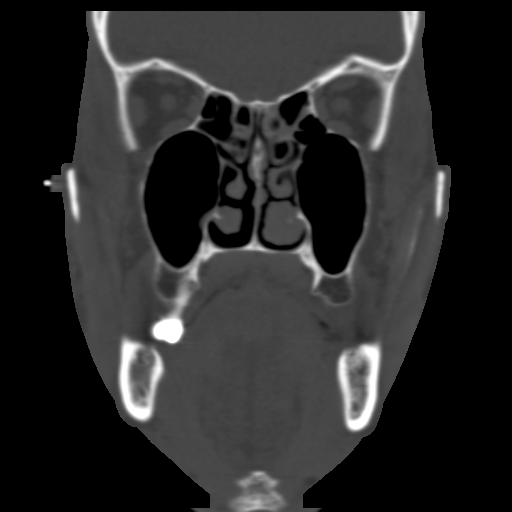
[im 51/91  bone]
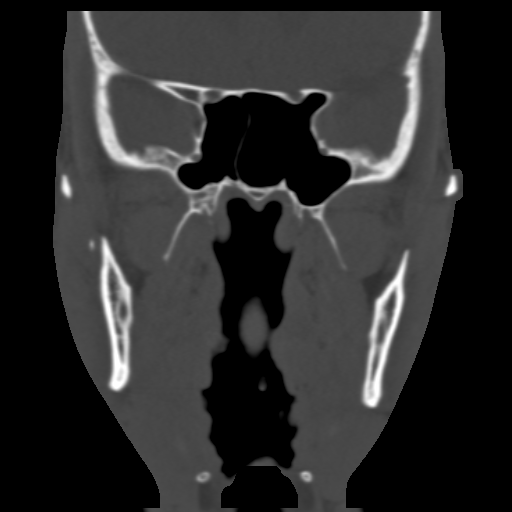

[Series 14: sagittals bone · sagittal · 0.37mm/px · 2 of 82 slices shown]
[im 28/82  bone]
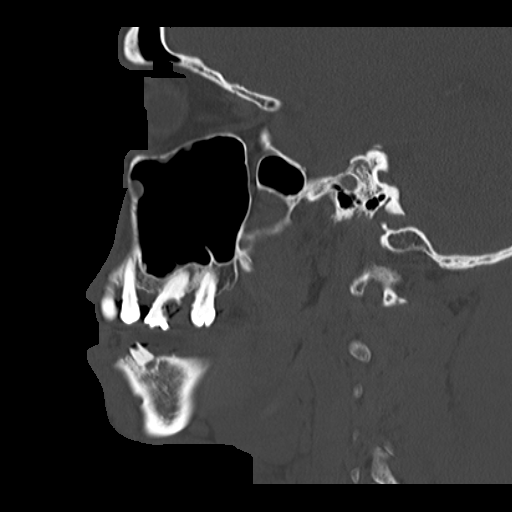
[im 55/82  bone]
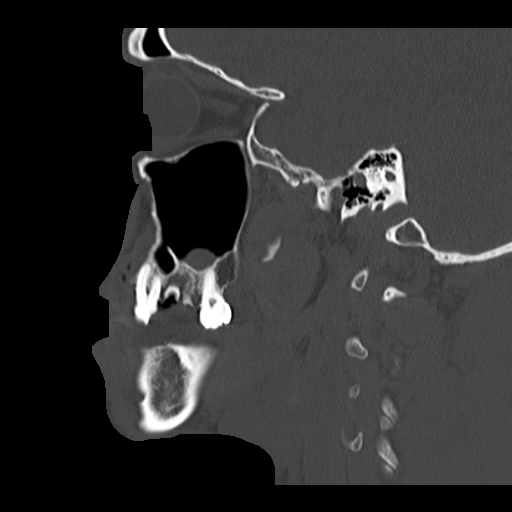

[16 of 47 positions shown; findings below may reference images not displayed]

FINDINGS: Fracture of the right mandible at the base of the condyle
with slight displacement and separation of fracture fragments.  No
secondary mandible fracture identified.

Prominent caries/dental disease.

Remote fracture anterior aspect of the right maxillary sinus and
right zygomatic arch.

Mild mucosal thickening ethmoid sinus air cells with polypoid
opacification inferior maxillary sinuses.

Visualized intracranial structures without acute abnormality.

Minimal asymmetry palatine tonsils without discrete mass.
IMPRESSION: Fracture of the right mandible at the base of the condyle with
slight displacement and separation of fracture fragments.  No
secondary mandible fracture identified.

Please see above.

This has been Jewel Pagano report utilizing dashboard call
feature.

## 2013-11-13 IMAGING — CR DG HAND COMPLETE 3+V*L*
3 series · 3 of 3 positions shown · non-contrast
Comparison: 11/13/2011.

CLINICAL DATA: Fall with limited range of motion and swelling.

LEFT HAND - COMPLETE 3+ VIEW

[x hand pa left]
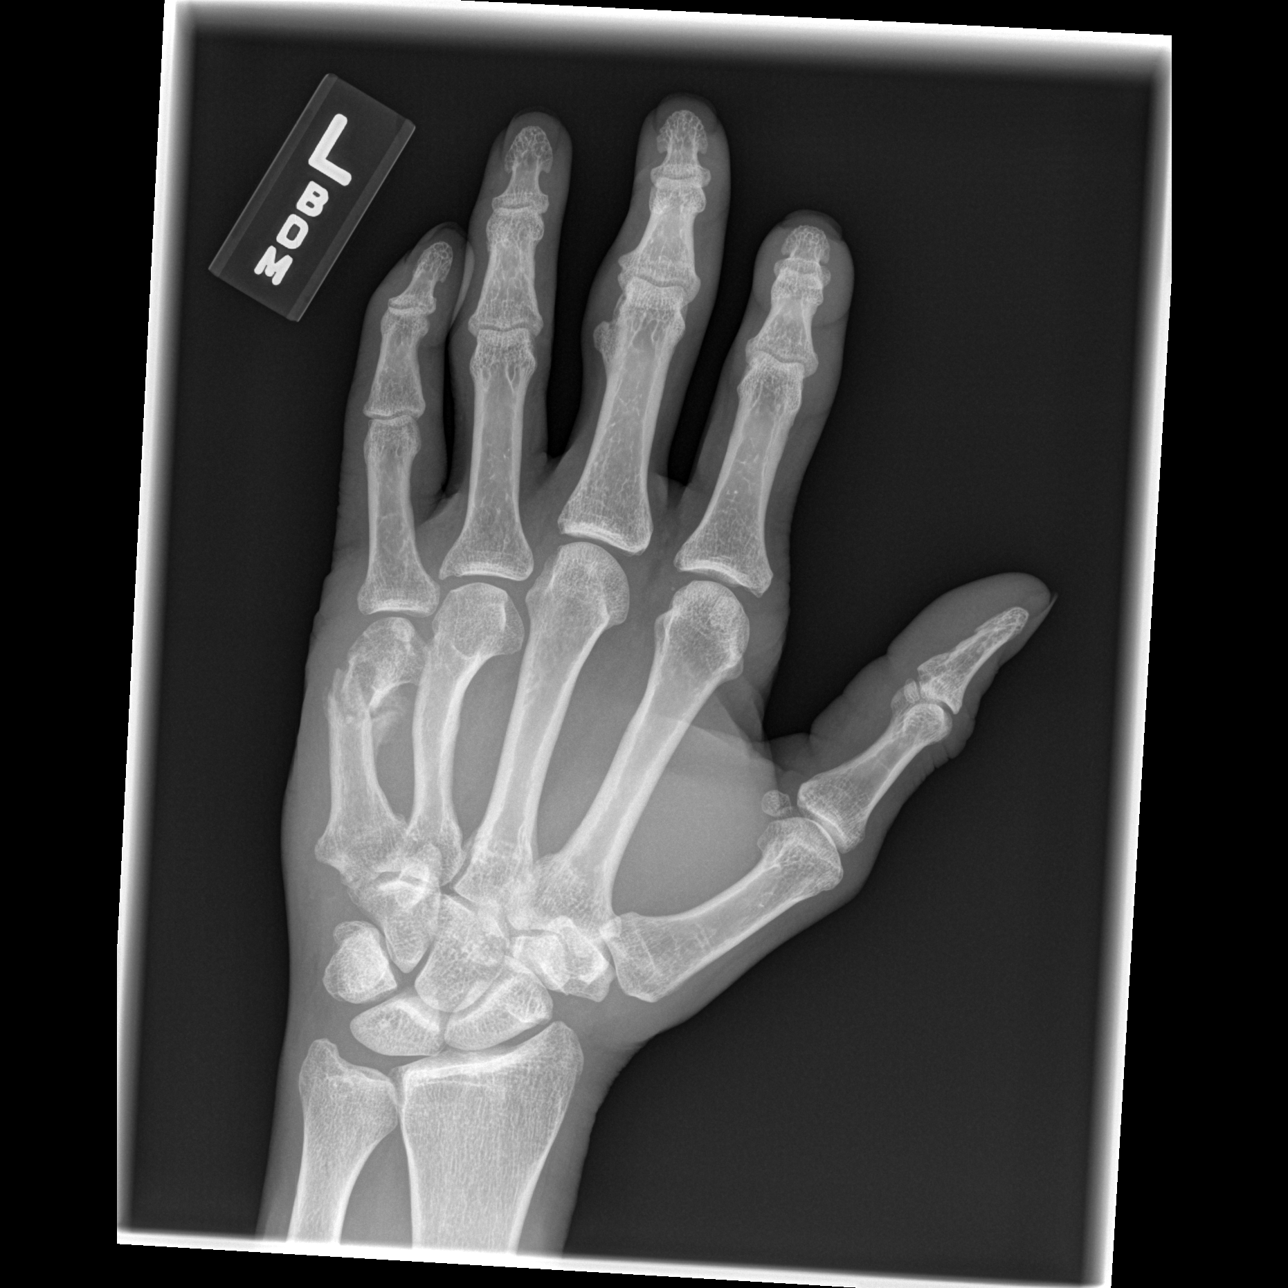

[x hand oblique left]
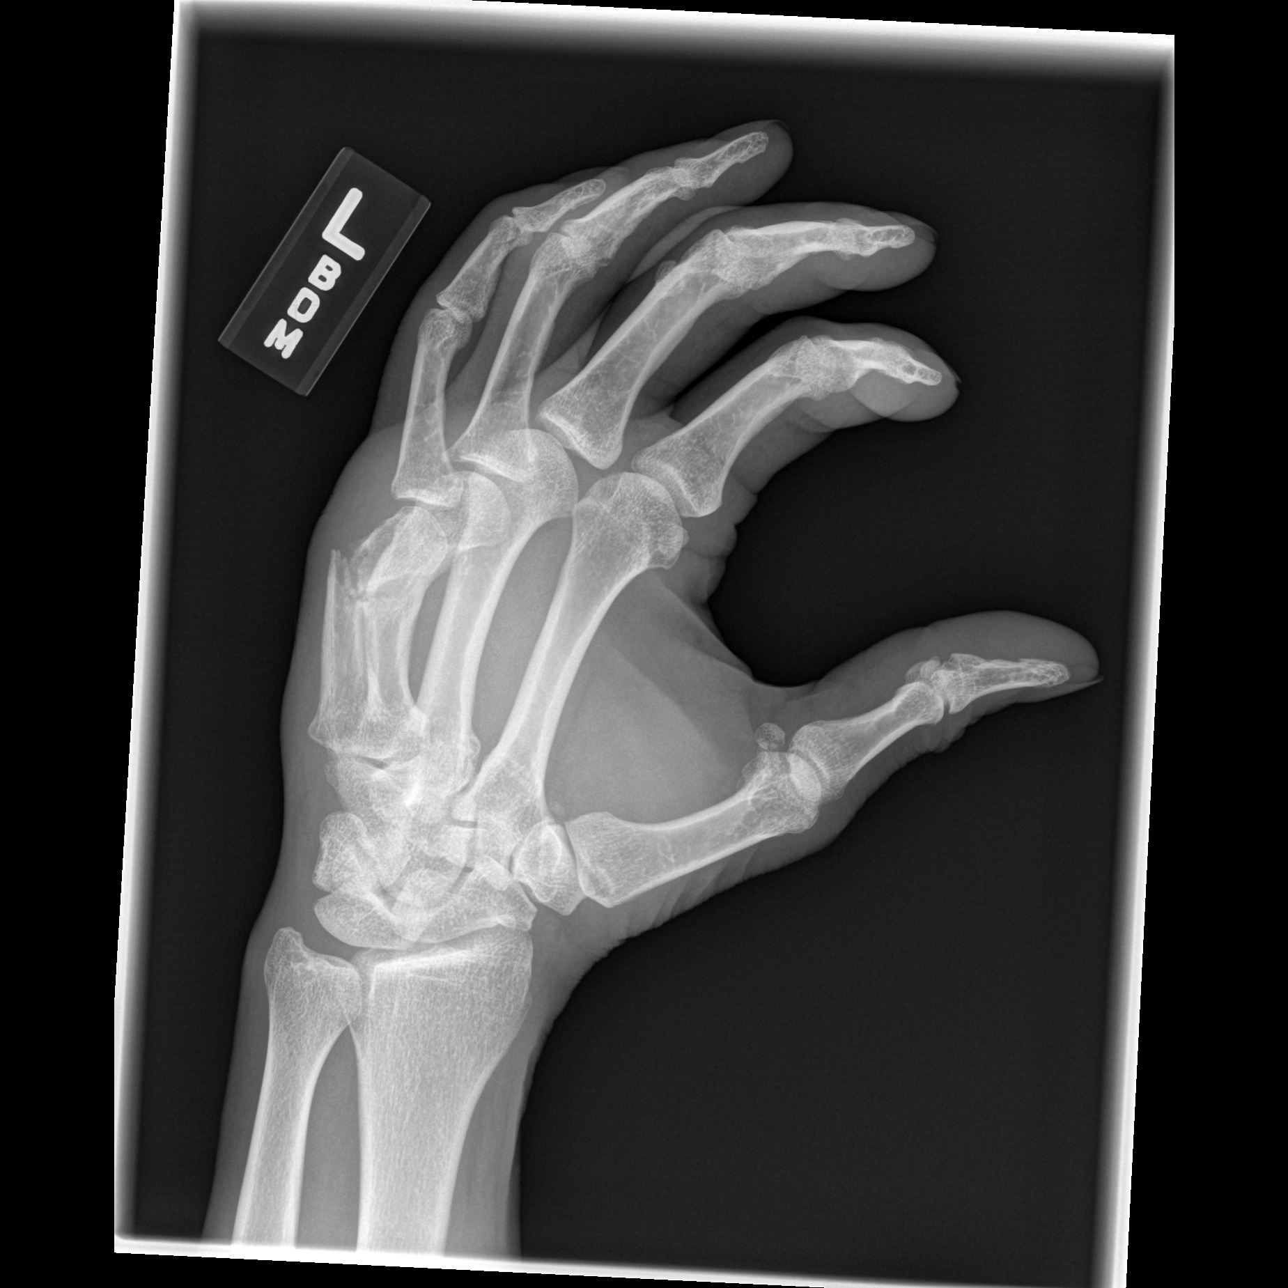

[x hand lat left]
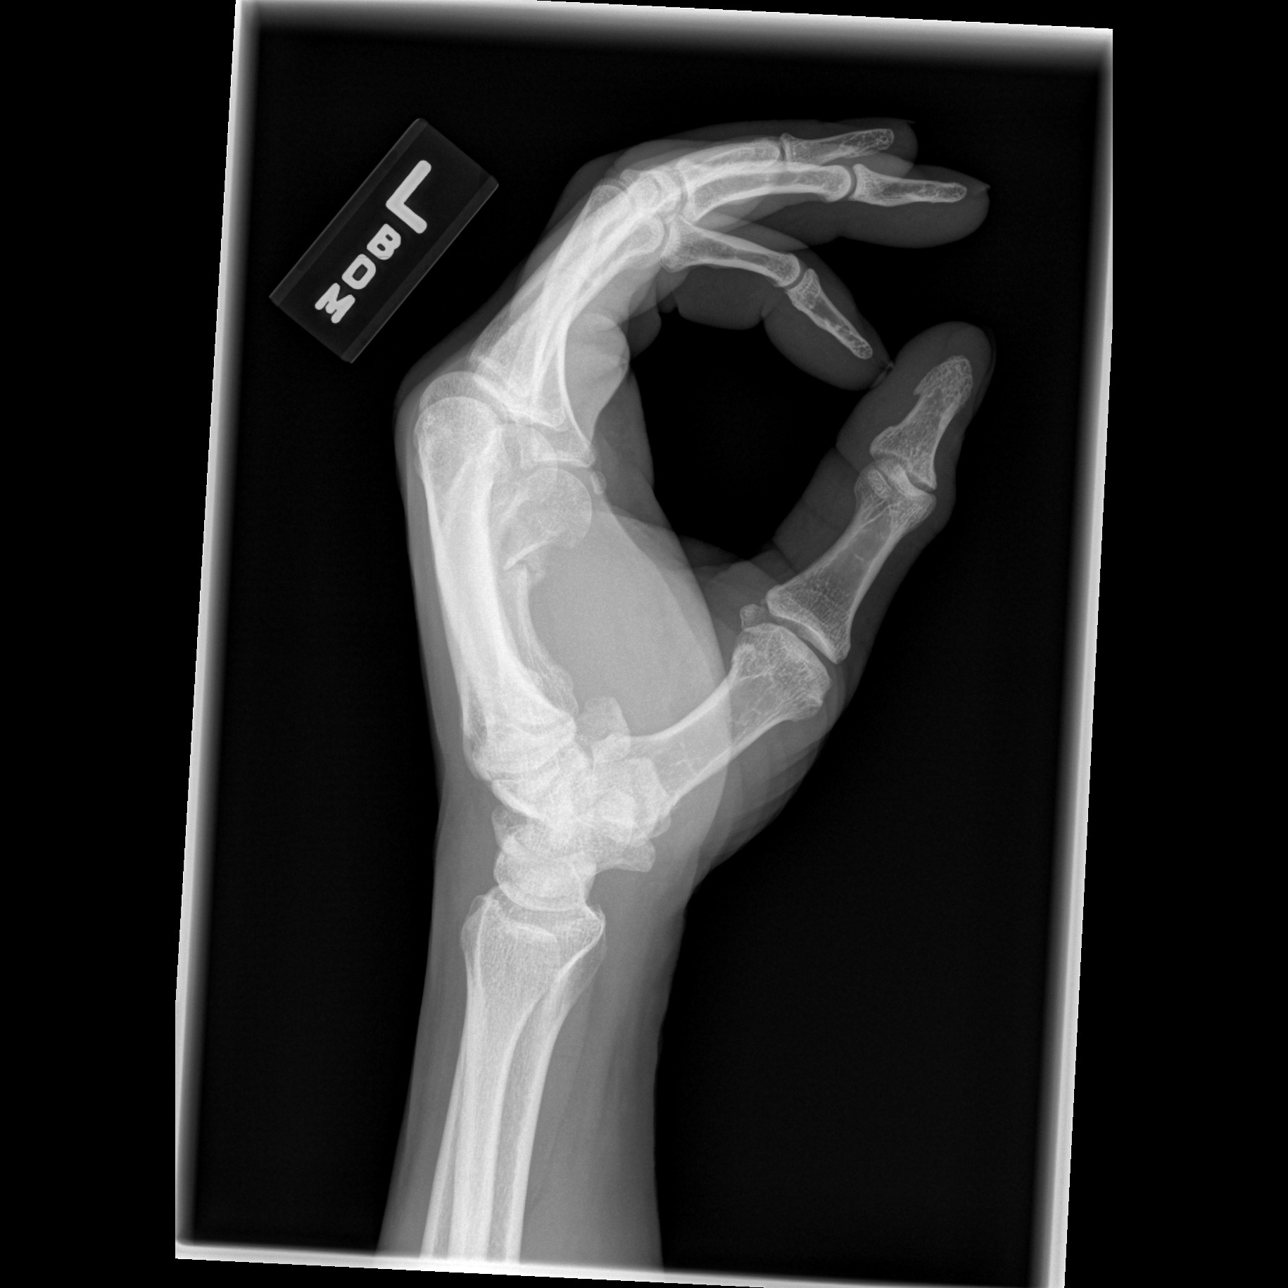

[3 of 3 positions shown; findings below may reference images not displayed]

FINDINGS: There is a healing mildly displaced fracture of the fifth
distal metacarpal shaft.  Alignment is unchanged from 11/13/2011.
Question old fracture of the fourth metacarpal shaft.
IMPRESSION: Healing fifth metacarpal shaft fracture, unchanged in alignment
from 11/13/2011.

## 2014-04-18 ENCOUNTER — Emergency Department (HOSPITAL_COMMUNITY)
Admission: EM | Admit: 2014-04-18 | Discharge: 2014-04-18 | Disposition: A | Discharge status: Home / Self Care | Payer: Self-pay | Attending: Emergency Medicine | Admitting: Emergency Medicine

## 2014-04-18 ENCOUNTER — Encounter (HOSPITAL_COMMUNITY): Payer: Self-pay | Admitting: Emergency Medicine

## 2014-04-18 DIAGNOSIS — Z79899 Other long term (current) drug therapy: Secondary | ICD-10-CM | POA: Insufficient documentation

## 2014-04-18 DIAGNOSIS — Z720 Tobacco use: Secondary | ICD-10-CM | POA: Insufficient documentation

## 2014-04-18 DIAGNOSIS — Z7982 Long term (current) use of aspirin: Secondary | ICD-10-CM | POA: Insufficient documentation

## 2014-04-18 DIAGNOSIS — Z791 Long term (current) use of non-steroidal anti-inflammatories (NSAID): Secondary | ICD-10-CM | POA: Insufficient documentation

## 2014-04-18 DIAGNOSIS — I10 Essential (primary) hypertension: Secondary | ICD-10-CM | POA: Insufficient documentation

## 2014-04-18 DIAGNOSIS — M25561 Pain in right knee: Secondary | ICD-10-CM | POA: Insufficient documentation

## 2014-04-18 DIAGNOSIS — M25562 Pain in left knee: Secondary | ICD-10-CM | POA: Insufficient documentation

## 2014-04-18 MED ORDER — TRAMADOL-ACETAMINOPHEN 37.5-325 MG PO TABS: 1.0000 | ORAL_TABLET | Freq: Four times a day (QID) | ORAL | Status: AC | PRN

## 2014-04-18 NOTE — ED Notes (Addendum)
Back and left knee swelling and going out on him x 1 week no injury

## 2014-04-18 NOTE — Discharge Instructions (Signed)
Take the prescribed medication as directed. Follow-up with Dr. Victorino DikeHewitt for further evaluation/managment. Return to the ED for new or worsening symptoms.

## 2014-04-18 NOTE — ED Provider Notes (Signed)
CSN: [id number]     Arrival date & time 04/18/14  1028 History  This chart was scribed for non-physician practitioner, Sharilyn SitesLisa Susan Bleich, PA-C,working with Warnell Foresterrey Wofford, MD, by Karle PlumberJennifer Tensley, ED Scribe. This patient was seen in room TR08C/TR08C and the patient's care was started at 11:05 AM.  Chief Complaint  Patient presents with  . Knee Pain   Patient is a 48 y.o. male presenting with knee pain. The history is provided by the patient. No language interpreter was used.  Knee Pain Associated symptoms: no fever     HPI Comments:  Kyle Cantu is a 48 y.o. male with PMH of HTN who presents to the Emergency Department complaining of waxing and waning bilateral knee pain that began one week ago. He reports associated swelling of the left  knee and states it is worse than the right. He reports taking Ibuprofen for the pain with no significant relief of the pain. Walking and bearing weight make the pain worse. Resting makes the pain better. He denies numbness, tingling or weakness of bilateral lower extremities, warmth or redness of the knees, fever or chills. He denies trauma, injury or fall.  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  No fever, chills.  No hx of gout.  Past Medical History  Diagnosis Date  . Hypertension    Past Surgical History  Procedure Laterality Date  . Mandible fracture surgery     No family history on file. History  Substance Use Topics  . Smoking status: Current Every Day Smoker  . Smokeless tobacco: Not on file  . Alcohol Use: No    Review of Systems  Constitutional: Negative for fever and chills.  Musculoskeletal: Positive for arthralgias. Negative for gait problem.  Skin: Negative for color change and wound.  Neurological: Negative for weakness and numbness.  All other systems reviewed and are negative.   Allergies  Review of patient's allergies indicates no known allergies.  Home  Medications   Prior to Admission medications   Medication Sig Start Date End Date Taking? Authorizing Provider  aspirin 325 MG tablet Take 325 mg by mouth once. For pain   Yes Historical Provider, MD  lisinopril-hydrochlorothiazide (PRINZIDE,ZESTORETIC) 20-12.5 MG per tablet Take 1 tablet by mouth daily.   Yes Historical Provider, MD  cyclobenzaprine (FLEXERIL) 10 MG tablet Take 1 tablet (10 mg total) by mouth 2 (two) times daily as needed for muscle spasms. Patient not taking: Reported on 04/18/2014 08/18/13   Emilia BeckKaitlyn Szekalski, PA-C  ibuprofen (ADVIL,MOTRIN) 800 MG tablet Take 1 tablet (800 mg total) by mouth 3 (three) times daily. Patient not taking: Reported on 04/18/2014 08/18/13   Emilia BeckKaitlyn Szekalski, PA-C   Triage Vitals: BP 118/83 mmHg  Pulse 87  Temp(Src) 97.9 F (36.6 C) (Oral)  Resp 16  Wt 210 lb (95.255 kg)  SpO2 100% Physical Exam  Constitutional: He is oriented to person, place, and time. He appears well-developed and well-nourished.  HENT:  Head: Normocephalic and atraumatic.  Mouth/Throat: Oropharynx is clear and moist.  Eyes: Conjunctivae and EOM are normal. Pupils are equal, round, and reactive to light.  Neck: Normal range of motion.  Cardiovascular: Normal rate, regular rhythm and normal heart sounds.   Pulmonary/Chest: Effort normal and breath sounds normal.  Abdominal: Soft. Bowel sounds are normal.  Musculoskeletal: Normal range of motion. He exhibits no edema or tenderness.  Bilateral knees normal in appearance without bony deformities, swelling, erythema or signs  of infection. Full ROM of both knees. Mild crepitus noted. Ambulating without assistance.  Neurological: He is alert and oriented to person, place, and time.  Skin: Skin is warm and dry. No erythema.  Psychiatric: He has a normal mood and affect.  Nursing note and vitals reviewed.   ED Course  Procedures (including critical care time) DIAGNOSTIC STUDIES: Oxygen Saturation is 100% on RA, normal by my  interpretation.   COORDINATION OF CARE: 11:09 AM- Will prescribe pain medication and refer to orthopedist. Pt verbalizes understanding and agrees to plan.  Medications - No data to display  Labs Review Labs Reviewed - No data to display  Imaging Review No results found.   EKG Interpretation None      MDM   Final diagnoses:  Bilateral knee pain   Bilateral knee pain intermittently x 1 week.  No bony deformities or signs of infection on exam.  Full ROM with some crepitus noted.  Legs remains NVI.  Patient ambulating without difficulty.  Sx not consistent with gout, likely arthritis. Rx tramadol.  Encouraged FU with orthopedics for ongoing symptoms. Discussed plan with patient, he/she acknowledged understanding and agreed with plan of care.  Return precautions given for new or worsening symptoms.  I personally performed the services described in this documentation, which was scribed in my presence. The recorded information has been reviewed and is accurate.  Garlon HatchetLisa M Ronique Simerly, PA-C 04/18/14 1255  Warnell Foresterrey Wofford, MD 04/18/14 [phone]

## 2024-03-05 ENCOUNTER — Ambulatory Visit: Payer: Self-pay | Admitting: Physician Assistant
